# Patient Record
Sex: Male | Born: 2000 | Hispanic: Yes | Marital: Single | Smoking: Never smoker
Health system: Southern US, Community
[De-identification: ages and names within clinical notes are randomized; demographics above are authoritative.]

## PROBLEM LIST (undated history)

## (undated) ENCOUNTER — Ambulatory Visit (HOSPITAL_COMMUNITY): Admission: EM | Payer: Medicaid Other | Source: Home / Self Care

## (undated) DIAGNOSIS — T7840XA Allergy, unspecified, initial encounter: Secondary | ICD-10-CM

## (undated) DIAGNOSIS — J45909 Unspecified asthma, uncomplicated: Secondary | ICD-10-CM

## (undated) HISTORY — DX: Allergy, unspecified, initial encounter: T78.40XA

## (undated) HISTORY — DX: Unspecified asthma, uncomplicated: J45.909

---
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
## 2021-01-17 DIAGNOSIS — S129XXA Fracture of neck, unspecified, initial encounter: Secondary | ICD-10-CM | POA: Diagnosis not present

## 2021-01-17 DIAGNOSIS — S12501D Unspecified nondisplaced fracture of sixth cervical vertebra, subsequent encounter for fracture with routine healing: Secondary | ICD-10-CM | POA: Diagnosis not present

## 2021-01-17 DIAGNOSIS — R030 Elevated blood-pressure reading, without diagnosis of hypertension: Secondary | ICD-10-CM | POA: Diagnosis not present

## 2021-05-16 DIAGNOSIS — H5213 Myopia, bilateral: Secondary | ICD-10-CM | POA: Diagnosis not present

## 2021-06-03 DIAGNOSIS — H52223 Regular astigmatism, bilateral: Secondary | ICD-10-CM | POA: Diagnosis not present

## 2021-06-03 DIAGNOSIS — H5213 Myopia, bilateral: Secondary | ICD-10-CM | POA: Diagnosis not present

## 2021-06-13 ENCOUNTER — Ambulatory Visit (HOSPITAL_COMMUNITY)
Admission: EM | Admit: 2021-06-13 | Discharge: 2021-06-13 | Disposition: A | Discharge status: Home / Self Care | Payer: Medicaid Other | Attending: Physician Assistant | Admitting: Physician Assistant

## 2021-06-13 ENCOUNTER — Encounter (HOSPITAL_COMMUNITY): Payer: Self-pay

## 2021-06-13 DIAGNOSIS — H00011 Hordeolum externum right upper eyelid: Secondary | ICD-10-CM

## 2021-06-13 MED ORDER — ERYTHROMYCIN 5 MG/GM OP OINT: TOPICAL_OINTMENT | OPHTHALMIC | 0 refills | Status: DC

## 2021-06-13 NOTE — ED Provider Notes (Signed)
?[facility] ? ? ? ?CSN: [id number] ?Arrival date & time: 06/13/21  1433 ? ? ?  ? ?History   ?Chief Complaint ?Chief Complaint  ?Patient presents with  ? Eye Pain  ? ? ?HPI ?Donald Kirk is a 21 y.o. male.  ? ?Patient presents today with a 2 to 3-day history of swelling and discomfort of his right upper eyelid.  He denies any visual disturbance, photophobia, nausea, vomiting, headache.  He does wear glasses but does not wear contacts.  Denies any exposure to fine particulate matter or chemicals.  He has tried using ice to help manage symptoms without improvement.  He denies any recent illness or additional symptoms including cough or congestion.  He has not tried any over-the-counter medication for symptom management. ? ? ?Past Medical History:  ?Diagnosis Date  ? Allergic   ? dust mite grass, mild cat by [facility]  ? Asthma   ? ? ?Patient Active Problem List  ? Diagnosis Date Noted  ? Headache in pediatric patient 02/22/2019  ? Gastroesophageal reflux disease without esophagitis 02/22/2019  ? Stress 02/22/2019  ? ? ?History reviewed. No pertinent surgical history. ? ? ? ? ?Home Medications   ? ?Prior to Admission medications   ?Medication Sig Start Date End Date Taking? Authorizing Provider  ?erythromycin ophthalmic ointment Place a 1/2 inch ribbon of ointment into the lower eyelid right eye twice a day for 7 days. 06/13/21  Yes Elmina Hendel K, PA-C  ?diphenhydrAMINE (BENADRYL) 25 MG tablet Take 1 tablet (25 mg total) by mouth every 6 (six) hours. 08/24/19   Vanetta MuldersZackowski, Scott, MD  ?EPINEPHrine (EPIPEN 2-PAK) 0.3 mg/0.3 mL IJ SOAJ injection Inject 0.3 mLs (0.3 mg total) into the muscle as needed for anaphylaxis. 06/06/18   Pricilla LovelessGoldston, Scott, MD  ?EPINEPHrine 0.3 mg/0.3 mL IJ SOAJ injection Inject 0.3 mLs (0.3 mg total) into the muscle as needed for anaphylaxis. 08/24/19   Vanetta MuldersZackowski, Scott, MD  ?famotidine (PEPCID) 20 MG tablet Take 1 tablet (20 mg total) by mouth 2 (two) times daily. 08/24/19   Vanetta MuldersZackowski, Scott,  MD  ? ? ?Family History ?Family History  ?Problem Relation Age of Onset  ? Diabetes Father   ? Healthy Mother   ? Healthy Brother   ? Cancer Neg Hx   ? Heart disease Neg Hx   ? Hypertension Neg Hx   ? ? ?Social History ?Social History  ? ?Tobacco Use  ? Smoking status: Never  ? Smokeless tobacco: Never  ?Vaping Use  ? Vaping Use: Never used  ?Substance Use Topics  ? Alcohol use: No  ? Drug use: No  ? ? ? ?Allergies   ?Bee venom ? ? ?Review of Systems ?Review of Systems  ?Constitutional:  Negative for activity change, appetite change, fatigue and fever.  ?Eyes:  Negative for photophobia, pain, discharge, redness, itching and visual disturbance.  ?Gastrointestinal:  Negative for abdominal pain, diarrhea, nausea and vomiting.  ?Neurological:  Negative for dizziness, light-headedness and headaches.  ? ? ?Physical Exam ?Triage Vital Signs ?ED Triage Vitals [06/13/21 1521]  ?Enc Vitals Group  ?   BP (!) 129/93  ?   Pulse Rate 64  ?   Resp 16  ?   Temp 98.1 ?F (36.7 ?C)  ?   Temp Source Oral  ?   SpO2 98 %  ?   Weight   ?   Height   ?   Head Circumference   ?   Peak Flow   ?   Pain  Score   ?   Pain Loc   ?   Pain Edu?   ?   Excl. in GC?   ? ?No data found. ? ?Updated Vital Signs ?BP (!) 129/93 (BP Location: Left Arm)   Pulse 64   Temp 98.1 ?F (36.7 ?C) (Oral)   Resp 16   SpO2 98%  ? ?Visual Acuity ?Right Eye Distance:   ?Left Eye Distance:   ?Bilateral Distance:   ? ?Right Eye Near:   ?Left Eye Near:    ?Bilateral Near:    ? ?Physical Exam ?Vitals reviewed.  ?Constitutional:   ?   General: He is awake.  ?   Appearance: Normal appearance. He is well-developed. He is not ill-appearing.  ?   Comments: Very pleasant male appears stated age in no acute distress sitting comfortably in exam room  ?HENT:  ?   Head: Normocephalic and atraumatic.  ?   Mouth/Throat:  ?   Pharynx: Uvula midline. No oropharyngeal exudate or posterior oropharyngeal erythema.  ?Eyes:  ?   General:     ?   Right eye: Hordeolum present.     ?   Left  eye: No hordeolum.  ?   Extraocular Movements: Extraocular movements intact.  ?   Conjunctiva/sclera: Conjunctivae normal.  ?   Pupils: Pupils are equal, round, and reactive to light.  ?Cardiovascular:  ?   Rate and Rhythm: Normal rate and regular rhythm.  ?   Heart sounds: Normal heart sounds, S1 normal and S2 normal. No murmur heard. ?Pulmonary:  ?   Effort: Pulmonary effort is normal.  ?   Breath sounds: Normal breath sounds. No stridor. No wheezing, rhonchi or rales.  ?   Comments: Clear to auscultation bilaterally ?Neurological:  ?   Mental Status: He is alert.  ?Psychiatric:     ?   Behavior: Behavior is cooperative.  ? ? ? ?UC Treatments / Results  ?Labs ?(all labs ordered are listed, but only abnormal results are displayed) ?Labs Reviewed - No data to display ? ?EKG ? ? ?Radiology ?No results found. ? ?Procedures ?Procedures (including critical care time) ? ?Medications Ordered in UC ?Medications - No data to display ? ?Initial Impression / Assessment and Plan / UC Course  ?I have reviewed the triage vital signs and the nursing notes. ? ?Pertinent labs & imaging results that were available during my care of the patient were reviewed by me and considered in my medical decision making (see chart for details). ? ?  ? ?Fluorescein staining was deferred as symptoms are consistent with hordeolum and patient denies any foreign body sensation.  He was started on erythromycin ointment twice daily with instruction this can blurred his vision a few minutes after application of medication and to plan accordingly.  He is to avoid touching tip of medication bottle to his eye and make sure to wash his hands prior to handling medication.  He can use lubricating eyedrops/artificial tears for additional symptom relief.  If symptoms or not improving quickly he is to follow-up with ophthalmology was given contact information for local provider.  Discussed that if he has any worsening symptoms including fever, visual change,  photophobia, headache, nausea, vomiting, eye redness he should be seen immediately.  Strict return precautions given to which he expressed understanding. ? ?Final Clinical Impressions(s) / UC Diagnoses  ? ?Final diagnoses:  ?Hordeolum externum of right upper eyelid  ? ? ? ?Discharge Instructions   ? ?  ?I believe that you have  a stye or an infected gland in your eyelid.  Please use erythromycin ointment twice daily.  Make sure to wash your hands prior to healing medication and do not touch tip of medication bottle to your eye.  You can use lubricating eyedrops/artificial tears for additional symptom relief.  I also recommend warm compress on the eye several times a day.  If your symptoms or not improving please follow-up with ophthalmologist; call to schedule an appointment.  If anything worsens and you have visual change, feeling there is something in your eye, headache, nausea, vomiting, pain you should be seen immediately. ? ? ? ? ?ED Prescriptions   ? ? Medication Sig Dispense Auth. Provider  ? erythromycin ophthalmic ointment Place a 1/2 inch ribbon of ointment into the lower eyelid right eye twice a day for 7 days. 3.5 g Shanikka Wonders K, PA-C  ? ?  ? ?PDMP not reviewed this encounter. ?  ?Jeani Hawking, PA-C ?06/13/21 1543 ? ?

## 2021-06-13 NOTE — Discharge Instructions (Signed)
I believe that you have a stye or an infected gland in your eyelid.  Please use erythromycin ointment twice daily.  Make sure to wash your hands prior to healing medication and do not touch tip of medication bottle to your eye.  You can use lubricating eyedrops/artificial tears for additional symptom relief.  I also recommend warm compress on the eye several times a day.  If your symptoms or not improving please follow-up with ophthalmologist; call to schedule an appointment.  If anything worsens and you have visual change, feeling there is something in your eye, headache, nausea, vomiting, pain you should be seen immediately. ?

## 2021-06-13 NOTE — ED Triage Notes (Signed)
C/o eye pain and drainage x 2-3 days.  ?

## 2021-08-07 ENCOUNTER — Ambulatory Visit (HOSPITAL_COMMUNITY)
Admission: EM | Admit: 2021-08-07 | Discharge: 2021-08-07 | Disposition: A | Discharge status: Home / Self Care | Payer: Medicaid Other | Attending: Internal Medicine | Admitting: Internal Medicine

## 2021-08-07 ENCOUNTER — Encounter (HOSPITAL_COMMUNITY): Payer: Self-pay

## 2021-08-07 VITALS — BP 149/80 | HR 75 | Temp 97.9°F | Resp 14

## 2021-08-07 DIAGNOSIS — N50812 Left testicular pain: Secondary | ICD-10-CM | POA: Diagnosis not present

## 2021-08-07 DIAGNOSIS — N50811 Right testicular pain: Secondary | ICD-10-CM

## 2021-08-07 DIAGNOSIS — Z113 Encounter for screening for infections with a predominantly sexual mode of transmission: Secondary | ICD-10-CM | POA: Diagnosis not present

## 2021-08-07 LAB — POCT URINALYSIS DIPSTICK, ED / UC
Bilirubin Urine: NEGATIVE
Glucose, UA: NEGATIVE mg/dL
Ketones, ur: 15 mg/dL — AB
Leukocytes,Ua: NEGATIVE
Nitrite: NEGATIVE
Protein, ur: NEGATIVE mg/dL
Specific Gravity, Urine: 1.025 (ref 1.005–1.030)
Urobilinogen, UA: 0.2 mg/dL (ref 0.0–1.0)
pH: 6.5 (ref 5.0–8.0)

## 2021-08-07 MED ORDER — CEFTRIAXONE SODIUM 500 MG IJ SOLR
500.0000 mg | Freq: Once | INTRAMUSCULAR | Status: AC
  Administered 2021-08-07: 500 mg via INTRAMUSCULAR

## 2021-08-07 MED ORDER — CEFTRIAXONE SODIUM 500 MG IJ SOLR
INTRAMUSCULAR | Status: AC
  Filled 2021-08-07: qty 500

## 2021-08-07 MED ORDER — LIDOCAINE HCL (PF) 1 % IJ SOLN
INTRAMUSCULAR | Status: AC
  Filled 2021-08-07: qty 2

## 2021-08-07 NOTE — Discharge Instructions (Addendum)
Your STI testing will be back in the next 2 to 3 days.  We gave you a shot in the clinic to prophylactically treat for gonorrhea. I believe your testicle pain is related to a possible sexually transmitted infection. Abstain from sexual intercourse for 1 week and use condoms to prevent spread of STIs.  If you develop any new or worsening symptoms or do not improve in the next 2 to 3 days, please return.  If your symptoms are severe, please go to the emergency room.  Follow-up with your primary care provider for further evaluation and management of your symptoms as well as ongoing wellness visits.  I hope you feel better!
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
## 2021-08-07 NOTE — ED Triage Notes (Signed)
C/O intermittent penile pains x approx 5 days -- "like that feeling like you gotta pee". Denies penile discharge. Wishes to be checked for STDs.

## 2021-08-08 ENCOUNTER — Telehealth (HOSPITAL_COMMUNITY): Payer: Self-pay | Admitting: Emergency Medicine

## 2021-08-08 LAB — CYTOLOGY, (ORAL, ANAL, URETHRAL) ANCILLARY ONLY
Chlamydia: POSITIVE — AB
Comment: NEGATIVE
Comment: NORMAL
Neisseria Gonorrhea: NEGATIVE

## 2021-08-08 MED ORDER — DOXYCYCLINE HYCLATE 100 MG PO CAPS: 100.0000 mg | ORAL_CAPSULE | Freq: Two times a day (BID) | ORAL | 0 refills | Status: AC

## 2021-08-26 ENCOUNTER — Encounter (HOSPITAL_COMMUNITY): Payer: Self-pay

## 2021-08-26 ENCOUNTER — Ambulatory Visit (HOSPITAL_COMMUNITY)
Admission: RE | Admit: 2021-08-26 | Discharge: 2021-08-26 | Disposition: A | Discharge status: Home / Self Care | Payer: Medicaid Other | Source: Ambulatory Visit | Attending: Physician Assistant | Admitting: Physician Assistant

## 2021-08-26 VITALS — BP 128/85 | HR 89 | Temp 98.9°F | Resp 16

## 2021-08-26 DIAGNOSIS — N50812 Left testicular pain: Secondary | ICD-10-CM | POA: Diagnosis present

## 2021-08-26 DIAGNOSIS — Z113 Encounter for screening for infections with a predominantly sexual mode of transmission: Secondary | ICD-10-CM | POA: Diagnosis present

## 2021-08-26 LAB — HIV ANTIBODY (ROUTINE TESTING W REFLEX): HIV Screen 4th Generation wRfx: NONREACTIVE

## 2021-08-26 MED ORDER — DOXYCYCLINE HYCLATE 100 MG PO CAPS: 100.0000 mg | ORAL_CAPSULE | Freq: Two times a day (BID) | ORAL | 0 refills | Status: AC
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
## 2021-08-26 NOTE — Discharge Instructions (Addendum)
Advised to take the doxycycline 100 mg 1 twice daily until completed. Advised to abstain from sexual activity until medication is completed.

## 2021-08-27 LAB — CYTOLOGY, (ORAL, ANAL, URETHRAL) ANCILLARY ONLY
Chlamydia: NEGATIVE
Comment: NEGATIVE
Comment: NEGATIVE
Comment: NORMAL
Neisseria Gonorrhea: NEGATIVE
Trichomonas: NEGATIVE

## 2021-08-27 LAB — RPR: RPR Ser Ql: NONREACTIVE

## 2022-03-17 ENCOUNTER — Other Ambulatory Visit: Payer: Self-pay

## 2022-03-17 ENCOUNTER — Emergency Department (HOSPITAL_BASED_OUTPATIENT_CLINIC_OR_DEPARTMENT_OTHER)
Admission: EM | Admit: 2022-03-17 | Discharge: 2022-03-17 | Disposition: A | Discharge status: Home / Self Care | Payer: Medicaid Other | Attending: Emergency Medicine | Admitting: Emergency Medicine

## 2022-03-17 ENCOUNTER — Encounter (HOSPITAL_BASED_OUTPATIENT_CLINIC_OR_DEPARTMENT_OTHER): Payer: Self-pay

## 2022-03-17 ENCOUNTER — Emergency Department (HOSPITAL_BASED_OUTPATIENT_CLINIC_OR_DEPARTMENT_OTHER): Payer: Medicaid Other

## 2022-03-17 ENCOUNTER — Ambulatory Visit
Admission: EM | Admit: 2022-03-17 | Discharge: 2022-03-17 | Disposition: A | Discharge status: Home / Self Care | Payer: Medicaid Other

## 2022-03-17 DIAGNOSIS — S0502XA Injury of conjunctiva and corneal abrasion without foreign body, left eye, initial encounter: Secondary | ICD-10-CM | POA: Insufficient documentation

## 2022-03-17 DIAGNOSIS — S0590XA Unspecified injury of unspecified eye and orbit, initial encounter: Secondary | ICD-10-CM

## 2022-03-17 DIAGNOSIS — H1132 Conjunctival hemorrhage, left eye: Secondary | ICD-10-CM

## 2022-03-17 DIAGNOSIS — Y9259 Other trade areas as the place of occurrence of the external cause: Secondary | ICD-10-CM | POA: Insufficient documentation

## 2022-03-17 DIAGNOSIS — S0592XA Unspecified injury of left eye and orbit, initial encounter: Secondary | ICD-10-CM | POA: Diagnosis present

## 2022-03-17 MED ORDER — ERYTHROMYCIN 5 MG/GM OP OINT: TOPICAL_OINTMENT | OPHTHALMIC | 0 refills | Status: AC

## 2022-03-17 MED ORDER — TETRACAINE HCL 0.5 % OP SOLN
2.0000 [drp] | Freq: Once | OPHTHALMIC | Status: AC
Start: 2022-03-17 — End: 2022-03-17
  Administered 2022-03-17: 2 [drp] via OPHTHALMIC
  Filled 2022-03-17: qty 4

## 2022-03-17 MED ORDER — FLUORESCEIN SODIUM 1 MG OP STRP
1.0000 | ORAL_STRIP | Freq: Once | OPHTHALMIC | Status: AC
  Administered 2022-03-17: 1 via OPHTHALMIC
  Filled 2022-03-17: qty 1
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
## 2022-03-17 NOTE — ED Notes (Signed)
Pt discharged home after verbalizing understanding of discharge instructions; nad noted. 
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
## 2022-03-17 NOTE — Discharge Instructions (Addendum)
Go to the emergency room for further evaluation of your eye injury

## 2022-03-17 NOTE — ED Triage Notes (Signed)
Pt states that he has some left eye redness and pain. X1 day
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
## 2023-12-23 ENCOUNTER — Emergency Department (HOSPITAL_COMMUNITY)
Admission: EM | Admit: 2023-12-23 | Discharge: 2023-12-23 | Disposition: A | Discharge status: Home / Self Care | Payer: Self-pay | Attending: Emergency Medicine | Admitting: Emergency Medicine

## 2023-12-23 DIAGNOSIS — L0100 Impetigo, unspecified: Secondary | ICD-10-CM | POA: Insufficient documentation

## 2023-12-23 MED ORDER — CEPHALEXIN 500 MG PO CAPS: 500.0000 mg | ORAL_CAPSULE | Freq: Four times a day (QID) | ORAL | 0 refills | Status: DC

## 2023-12-23 MED ORDER — MUPIROCIN CALCIUM 2 % EX CREA: 1.0000 | TOPICAL_CREAM | Freq: Two times a day (BID) | CUTANEOUS | 0 refills | Status: AC

## 2023-12-23 MED ORDER — MUPIROCIN CALCIUM 2 % EX CREA: 1.0000 | TOPICAL_CREAM | Freq: Two times a day (BID) | CUTANEOUS | 0 refills | Status: DC

## 2023-12-23 MED ORDER — CEPHALEXIN 500 MG PO CAPS: 500.0000 mg | ORAL_CAPSULE | Freq: Four times a day (QID) | ORAL | 0 refills | Status: AC

## 2023-12-23 NOTE — Discharge Instructions (Signed)
 Please follow-up closely with a primary care doctor on an outpatient basis.  Return to emergency department immediately for any new or worsening symptoms.  [facility] Primary Care Doctor List    Syliva Overman, MD. Specialty: [facility] Medicine Contact information: [address]  [place] [address]  [phone]   Lilyan Punt, MD. Specialty: [facility] Medicine Contact information: [address]  [place] [address]  [phone]   Avon Gully, MD Specialty: Internal Medicine Contact information: [address] [place] [address]  [phone]   Catalina Pizza, MD. Specialty: Internal Medicine Contact information: [address]  [place] [address]  [phone]    [facility] (Dr. Selena Batten) Specialty: Family Medicine Contact information: [address]  [place] [address]  [phone]   John Giovanni, MD. Specialty: [facility] Medicine Contact information: [address]  [address]  [place] [address]  [phone]   Carylon Perches, MD. Specialty: Internal Medicine Contact information: [address]  [address]  [place] [address]  [phone]   [facility]: [address] [phone]  [facility], Family medicine [address]  [phone]  [facility] [address] [place], [place] [phone]  [facility]: [address] [phone]    [facility] - Benita Stabile  [address] [place], [place] [address] [phone]  Services [facility] offers a variety of basic health services.  Services include but are not limited to: Blood pressure checks  Heart rate checks  Blood sugar checks  Urine analysis  Rapid strep tests  Pregnancy tests.  Health education and referrals  People needing more complex services will be directed to a physician online. Using these virtual visits, doctors can evaluate  and prescribe medicine and treatments. There will be no medication on-site, though [facility] will help patients fill their prescriptions at little to no cost.   For More information please go to: [email or web address]  Allergy and Asthma:    [address] [place] [phone]  Urology:  [address]  [place] [phone]  [facility]  [address] [place], [place] [phone]  Orthopedics   [address] [place], [place] [phone]  Endocrinology  [address] [place], [place] [phone]  Podiatry: [facility] [phone]

## 2023-12-23 NOTE — ED Provider Notes (Signed)
 [place] EMERGENCY DEPARTMENT AT [facility] Provider Note   CSN: [id number] Arrival date & time: 12/23/23  1012     Patient presents with: Rash   Donald Kirk is a 23 y.o. male.   Patient is a 23 year old male who presents emergency department the chief complaint of a rash present to the left side of his face and neck.  He also notes that he has a few lesions over the anterior aspect of his abdomen.  Patient denies any associated pruritus.  He denies any new soaps, lotions, deodorants, detergents, medications.  He has had no exposure to someone with similar rash.  He does note that he has newly started boxing and is unsure if this may be playing a role.  He has been wearing headgear for this.   Rash      Prior to Admission medications   Medication Sig Start Date End Date Taking? Authorizing Provider  cephALEXin (KEFLEX) 500 MG capsule Take 1 capsule (500 mg total) by mouth 4 (four) times daily for 10 days. 12/23/23 01/02/24 Yes Daralene Bruckner D, PA-C  mupirocin cream (BACTROBAN) 2 % Apply 1 Application topically 2 (two) times daily. 12/23/23  Yes Daralene Bruckner D, PA-C  diphenhydrAMINE  (BENADRYL ) 25 MG tablet Take 1 tablet (25 mg total) by mouth every 6 (six) hours. 08/24/19   Zackowski, Scott, MD  doxycycline  (VIBRAMYCIN ) 100 MG capsule Take 1 capsule (100 mg total) by mouth 2 (two) times daily. 08/26/21   Lynwood Lenis, PA-C  EPINEPHrine  (EPIPEN  2-PAK) 0.3 mg/0.3 mL IJ SOAJ injection Inject 0.3 mLs (0.3 mg total) into the muscle as needed for anaphylaxis. 06/06/18   Freddi Hamilton, MD  EPINEPHrine  0.3 mg/0.3 mL IJ SOAJ injection Inject 0.3 mLs (0.3 mg total) into the muscle as needed for anaphylaxis. 08/24/19   Zackowski, Scott, MD  erythromycin  ophthalmic ointment Place a 1/2 inch ribbon of ointment into the lower eyelid. 03/17/22   Hildegard Loge, PA-C  famotidine  (PEPCID ) 20 MG tablet Take 1 tablet (20 mg total) by mouth 2 (two) times daily. 08/24/19    Zackowski, Scott, MD    Allergies: Bee venom    Review of Systems  Skin:  Positive for rash.  All other systems reviewed and are negative.   Updated Vital Signs BP 124/76   Pulse 60   Temp 98.7 F (37.1 C)   Resp 18   SpO2 97%   Physical Exam Vitals and nursing note reviewed.  Constitutional:      General: He is not in acute distress.    Appearance: Normal appearance. He is not ill-appearing.  HENT:     Head: Normocephalic and atraumatic.     Mouth/Throat:     Mouth: Mucous membranes are moist.  Eyes:     Extraocular Movements: Extraocular movements intact.     Conjunctiva/sclera: Conjunctivae normal.     Pupils: Pupils are equal, round, and reactive to light.  Cardiovascular:     Rate and Rhythm: Normal rate and regular rhythm.     Pulses: Normal pulses.  Pulmonary:     Effort: Pulmonary effort is normal. No respiratory distress.  Musculoskeletal:        General: Normal range of motion.     Cervical back: Normal range of motion and neck supple. No rigidity or tenderness.  Skin:    General: Skin is warm and dry.     Comments: Crusted lesions noted over the left side of the face, chin, left side of neck and abdomen, no areas  of petechiae, purpura, bullae, no lesions to palms, soles, oral mucosa  Neurological:     General: No focal deficit present.     Mental Status: He is alert and oriented to person, place, and time. Mental status is at baseline.  Psychiatric:        Mood and Affect: Mood normal.        Behavior: Behavior normal.        Thought Content: Thought content normal.        Judgment: Judgment normal.     (all labs ordered are listed, but only abnormal results are displayed) Labs Reviewed - No data to display  EKG: None  Radiology: No results found.   Procedures   Medications Ordered in the ED - No data to display                                  Medical Decision Making Patient is doing well at this time and is stable for discharge home.   Discussed with patient that we will treat him for impetigo at this point.  He has no indication for Elspeth Louder syndrome, toxic epidermal necrolysis, tickborne illness, syphilis, meningitis, HSV.  There is no associated petechiae, purpura, bullae.  He has no lesions to palms, soles, oral mucosa.  Close follow-up with PCP was discussed as well as strict turn precautions for any new or worsening symptoms.  Patient voiced understanding and had no additional questions.  Risk Prescription drug management.        Final diagnoses:  Impetigo    ED Discharge Orders          Ordered    cephALEXin (KEFLEX) 500 MG capsule  4 times daily        12/23/23 1116    mupirocin cream (BACTROBAN) 2 %  2 times daily        12/23/23 1116               Paislynn Hegstrom D, PA-C 12/23/23 1119    Suzette Pac, MD 12/24/23 1654

## 2023-12-23 NOTE — ED Triage Notes (Addendum)
 Pt complains of bumps that have been coming up on neck, face and arm x 1 week. Denies any itching. States it burns every now again. Pt denies any change in lotion, detergents or face wash. Pt started boxing a few months ago and wears a face mask. Pt states he has been sweating a lot more while boxing
